# Patient Record
Sex: Male | Born: 2005
Health system: Southern US, Community
[De-identification: ages and names within clinical notes are randomized; demographics above are authoritative.]

## PROBLEM LIST (undated history)

## (undated) DIAGNOSIS — IMO0001 Reserved for inherently not codable concepts without codable children: Secondary | ICD-10-CM

## (undated) DIAGNOSIS — J302 Other seasonal allergic rhinitis: Secondary | ICD-10-CM

## (undated) DIAGNOSIS — J45909 Unspecified asthma, uncomplicated: Secondary | ICD-10-CM

## (undated) DIAGNOSIS — K219 Gastro-esophageal reflux disease without esophagitis: Secondary | ICD-10-CM

---
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
## 2023-11-29 ENCOUNTER — Other Ambulatory Visit: Payer: Self-pay
